# Patient Record
Sex: Male | Born: 2000 | Race: White | Hispanic: No | Marital: Single | Smoking: Former smoker
Health system: Southern US, Community
[De-identification: ages and names within clinical notes are randomized; demographics above are authoritative.]

## PROBLEM LIST (undated history)

## (undated) DIAGNOSIS — T7840XA Allergy, unspecified, initial encounter: Secondary | ICD-10-CM

## (undated) DIAGNOSIS — J45909 Unspecified asthma, uncomplicated: Secondary | ICD-10-CM

## (undated) DIAGNOSIS — Z9229 Personal history of other drug therapy: Secondary | ICD-10-CM

## (undated) DIAGNOSIS — Z789 Other specified health status: Secondary | ICD-10-CM

## (undated) DIAGNOSIS — K219 Gastro-esophageal reflux disease without esophagitis: Secondary | ICD-10-CM

## (undated) DIAGNOSIS — F419 Anxiety disorder, unspecified: Secondary | ICD-10-CM

## (undated) HISTORY — DX: Anxiety disorder, unspecified: F41.9

## (undated) HISTORY — PX: NASAL HEMORRHAGE CONTROL: SHX287

## (undated) HISTORY — PX: UPPER GI ENDOSCOPY: SHX6162

## (undated) HISTORY — PX: TONSILLECTOMY: SUR1361

## (undated) HISTORY — DX: Allergy, unspecified, initial encounter: T78.40XA

## (undated) HISTORY — DX: Gastro-esophageal reflux disease without esophagitis: K21.9

---
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
## 2019-02-10 ENCOUNTER — Ambulatory Visit
Admission: EM | Admit: 2019-02-10 | Discharge: 2019-02-10 | Disposition: A | Discharge status: Home / Self Care | Payer: BC Managed Care – PPO | Attending: Family Medicine | Admitting: Family Medicine

## 2019-02-10 ENCOUNTER — Other Ambulatory Visit: Payer: Self-pay

## 2019-02-10 DIAGNOSIS — R369 Urethral discharge, unspecified: Secondary | ICD-10-CM | POA: Diagnosis not present

## 2019-02-10 LAB — URINALYSIS, COMPLETE (UACMP) WITH MICROSCOPIC
Bacteria, UA: NONE SEEN
Bilirubin Urine: NEGATIVE
Glucose, UA: NEGATIVE mg/dL
Ketones, ur: NEGATIVE mg/dL
Nitrite: NEGATIVE
Protein, ur: NEGATIVE mg/dL
Specific Gravity, Urine: 1.025 (ref 1.005–1.030)
pH: 6 (ref 5.0–8.0)

## 2019-02-10 LAB — CHLAMYDIA/NGC RT PCR (ARMC ONLY)??????????
Chlamydia Tr: NOT DETECTED
N gonorrhoeae: DETECTED — AB

## 2019-02-10 MED ORDER — CEFTRIAXONE SODIUM 250 MG IJ SOLR
250.0000 mg | Freq: Once | INTRAMUSCULAR | Status: AC
  Administered 2019-02-10: 250 mg via INTRAMUSCULAR

## 2019-02-10 MED ORDER — AZITHROMYCIN 500 MG PO TABS
1000.0000 mg | ORAL_TABLET | Freq: Once | ORAL | Status: AC
  Administered 2019-02-10: 1000 mg via ORAL

## 2019-02-10 NOTE — ED Provider Notes (Signed)
[facility]    CSN: [id number] Arrival date & time: 02/10/19  2703     History   Chief Complaint Chief Complaint  Patient presents with  . Dysuria    HPI DAESHON GRAMMATICO is a 18 y.o. male.   18 yo male with a c/o pus discharge from his penis and burning with urination for the past 2 days. Denies any fevers, chills. States he's sexually active and does not always use barrier protection. Denies prior h/o of STDs.     Dysuria Presenting symptoms: dysuria     Past Medical History:  Diagnosis Date  . Immunizations up to date in pediatric patient   . Reactive airway disease     Patient Active Problem List   Diagnosis Date Noted  . Closed fracture of phalanx or phalanges of hand 11/26/2014    Past Surgical History:  Procedure Laterality Date  . TONSILLECTOMY    . UPPER GI ENDOSCOPY         Home Medications    Prior to Admission medications   Not on File    Family History Family History  Problem Relation Age of Onset  . Hypertension Mother   . Diabetes Father     Social History Social History   Tobacco Use  . Smoking status: Never Smoker  . Smokeless tobacco: Current User    Types: Chew  Substance Use Topics  . Alcohol use: No  . Drug use: No     Allergies   Patient has no known allergies.   Review of Systems Review of Systems  Genitourinary: Positive for dysuria.     Physical Exam Triage Vital Signs ED Triage Vitals  Enc Vitals Group     BP 02/10/19 0831 126/78     Pulse Rate 02/10/19 0831 80     Resp 02/10/19 0831 18     Temp 02/10/19 0831 98.3 F (36.8 C)     Temp Source 02/10/19 0831 Oral     SpO2 02/10/19 0831 100 %     Weight 02/10/19 0829 140 lb (63.5 kg)     Height 02/10/19 0829 5\' 11"  (1.803 m)     Head Circumference --      Peak Flow --      Pain Score 02/10/19 0829 10     Pain Loc --      Pain Edu? --      Excl. in [place]? --    No data found.  Updated Vital Signs BP 126/78 (BP Location: Right Arm)    Pulse 80   Temp 98.3 F (36.8 C) (Oral)   Resp 18   Ht 5\' 11"  (1.803 m)   Wt 63.5 kg   SpO2 100%   BMI 19.53 kg/m   Visual Acuity Right Eye Distance:   Left Eye Distance:   Bilateral Distance:    Right Eye Near:   Left Eye Near:    Bilateral Near:     Physical Exam Vitals signs and nursing note reviewed.  Constitutional:      General: He is not in acute distress.    Appearance: He is not toxic-appearing or diaphoretic.  Genitourinary:    Penis: Discharge present.      Scrotum/Testes: Normal.  Lymphadenopathy:     Lower Body: Right inguinal adenopathy present.  Neurological:     Mental Status: He is alert.      UC Treatments / Results  Labs (all labs ordered are listed, but only abnormal results are displayed) Labs  Reviewed  URINALYSIS, COMPLETE (UACMP) WITH MICROSCOPIC - Abnormal; Notable for the following components:      Result Value   Hgb urine dipstick SMALL (*)    Leukocytes,Ua TRACE (*)    All other components within normal limits  CHLAMYDIA/NGC RT PCR [facility] ONLY)    EKG   Radiology No results found.  Procedures Procedures (including critical care time)  Medications Ordered in UC Medications  cefTRIAXone (ROCEPHIN) injection 250 mg (250 mg Intramuscular Given 02/10/19 0921)  azithromycin (ZITHROMAX) tablet 1,000 mg (1,000 mg Oral Given 02/10/19 0921)    Initial Impression / Assessment and Plan / UC Course  I have reviewed the triage vital signs and the nursing notes.  Pertinent labs & imaging results that were available during my care of the patient were reviewed by me and considered in my medical decision making (see chart for details).      Final Clinical Impressions(s) / UC Diagnoses   Final diagnoses:  Penile discharge    ED Prescriptions    None      1. Lab results and diagnosis reviewed with patient 2. Patient given rocephin 250mg  IM x 1 and zithromax 1gm po x 1 3. Await final test results 4. Follow-up prn    Controlled Substance Prescriptions [place] Controlled Substance Registry consulted? Not Applicable   Payton Mccallumonty, [place], MD 02/10/19 [phone]

## 2019-02-10 NOTE — ED Triage Notes (Signed)
Patient complains of painful urination and burning with urination x 1 day. Patient states that he has also noticed a pus like discharge from his penis.

## 2019-02-12 ENCOUNTER — Telehealth (HOSPITAL_COMMUNITY): Payer: Self-pay | Admitting: Emergency Medicine

## 2019-02-12 NOTE — Telephone Encounter (Signed)
Test for gonorrhea was positive. This was treated at the urgent care visit with IM rocephin 250mg and po zithromax 1g. Pt needs education to refrain from sexual intercourse for 7 days after treatment to give the medicine time to work. Sexual partners need to be notified and tested/treated. Condoms may reduce risk of reinfection. Recheck or followup with PCP for further evaluation if symptoms are not improving. [facility] notified.   Attempted to reach patient. No answer at this time. Voicemail left.     

## 2019-02-14 ENCOUNTER — Telehealth (HOSPITAL_COMMUNITY): Payer: Self-pay | Admitting: Emergency Medicine

## 2019-02-14 NOTE — Telephone Encounter (Signed)
Attempted to reach patient x2, mother answered, gave correct number, tried calling patient again, no answer, no voicemail set up.

## 2019-02-17 ENCOUNTER — Telehealth (HOSPITAL_COMMUNITY): Payer: Self-pay | Admitting: Emergency Medicine

## 2019-02-17 NOTE — Telephone Encounter (Signed)
Attempted to reach patient x3. No answer at this time. No voicemail. Letter sent

## 2021-05-24 DIAGNOSIS — J340 Abscess, furuncle and carbuncle of nose: Secondary | ICD-10-CM | POA: Diagnosis not present

## 2021-05-24 DIAGNOSIS — R040 Epistaxis: Secondary | ICD-10-CM | POA: Diagnosis not present

## 2021-06-13 ENCOUNTER — Ambulatory Visit: Admit: 2021-06-13 | Discharge status: Against Medical Advice | Payer: BC Managed Care – PPO

## 2021-06-13 ENCOUNTER — Ambulatory Visit
Admission: EM | Admit: 2021-06-13 | Discharge: 2021-06-13 | Disposition: A | Discharge status: Home / Self Care | Payer: BC Managed Care – PPO | Attending: Family | Admitting: Family

## 2021-06-13 ENCOUNTER — Other Ambulatory Visit: Payer: Self-pay

## 2021-06-13 DIAGNOSIS — R051 Acute cough: Secondary | ICD-10-CM | POA: Diagnosis not present

## 2021-06-13 DIAGNOSIS — J111 Influenza due to unidentified influenza virus with other respiratory manifestations: Secondary | ICD-10-CM

## 2021-06-13 DIAGNOSIS — R52 Pain, unspecified: Secondary | ICD-10-CM

## 2021-06-13 MED ORDER — OSELTAMIVIR PHOSPHATE 75 MG PO CAPS: 75.0000 mg | ORAL_CAPSULE | Freq: Two times a day (BID) | ORAL | 0 refills | Status: AC

## 2021-06-13 MED ORDER — ACETAMINOPHEN 325 MG PO TABS
650.0000 mg | ORAL_TABLET | Freq: Once | ORAL | Status: AC
  Administered 2021-06-13: 650 mg via ORAL

## 2021-06-13 NOTE — ED Provider Notes (Signed)
[facility]    CSN: [id number] Arrival date & time: 06/13/21  1751      History   Chief Complaint Chief Complaint  Patient presents with   Cough   Generalized Body Aches    HPI Mike Diaz is a 20 y.o. male.   20 year old male presents with irritated throat, body aches, cough, headache and fever of 101 since last night. Also having some nasal drainage and congestion. Denies any GI symptoms. Took Nyquil last night with minimal relief. No known exposure to Influenza or COVID 19. No other family members ill. No other chronic health issues. Takes no daily medication.   The history is provided by the patient.   Past Medical History:  Diagnosis Date   Immunizations up to date in pediatric patient    Reactive airway disease     Patient Active Problem List   Diagnosis Date Noted   Closed fracture of phalanx or phalanges of hand 11/26/2014    Past Surgical History:  Procedure Laterality Date   TONSILLECTOMY     UPPER GI ENDOSCOPY         Home Medications    Prior to Admission medications   Medication Sig Start Date End Date Taking? Authorizing Provider  oseltamivir (TAMIFLU) 75 MG capsule Take 1 capsule (75 mg total) by mouth every 12 (twelve) hours for 5 days. 06/13/21 06/18/21 Yes Lucila Klecka, Ali Lowe, NP    Family History Family History  Problem Relation Age of Onset   Hypertension Mother    Diabetes Father     Social History Social History   Tobacco Use   Smoking status: Never   Smokeless tobacco: Current    Types: Chew  Vaping Use   Vaping Use: Never used  Substance Use Topics   Alcohol use: No   Drug use: No     Allergies   Patient has no known allergies.   Review of Systems Review of Systems  Constitutional:  Positive for chills, fatigue and fever. Negative for activity change.  HENT:  Positive for congestion, postnasal drip, rhinorrhea and sore throat. Negative for ear discharge, ear pain, facial swelling, mouth sores,  nosebleeds, sinus pressure, sinus pain and trouble swallowing.   Eyes:  Negative for pain, discharge, redness and itching.  Respiratory:  Positive for cough. Negative for chest tightness and shortness of breath.   Gastrointestinal:  Negative for diarrhea, nausea and vomiting.  Musculoskeletal:  Positive for arthralgias and myalgias. Negative for neck pain and neck stiffness.  Skin:  Negative for color change and rash.  Allergic/Immunologic: Negative for environmental allergies, food allergies and immunocompromised state.  Neurological:  Positive for headaches. Negative for dizziness, tremors, seizures, syncope, weakness and numbness.  Hematological:  Negative for adenopathy. Does not bruise/bleed easily.    Physical Exam Triage Vital Signs ED Triage Vitals  Enc Vitals Group     BP 06/13/21 1801 122/70     Pulse Rate 06/13/21 1801 (!) 104     Resp 06/13/21 1801 18     Temp 06/13/21 1801 (!) 101.5 F (38.6 C)     Temp Source 06/13/21 1801 Oral     SpO2 06/13/21 1801 100 %     Weight 06/13/21 1801 145 lb (65.8 kg)     Height 06/13/21 1801 5\' 11"  (1.803 m)     Head Circumference --      Peak Flow --      Pain Score 06/13/21 1800 6     Pain Loc --  Pain Edu? --      Excl. in [place]? --    No data found.  Updated Vital Signs BP 122/70 (BP Location: Left Arm)   Pulse (!) 104   Temp (!) 101.5 F (38.6 C) (Oral)   Resp 18   Ht 5\' 11"  (1.803 m)   Wt 145 lb (65.8 kg)   SpO2 100%   BMI 20.22 kg/m   Visual Acuity Right Eye Distance:   Left Eye Distance:   Bilateral Distance:    Right Eye Near:   Left Eye Near:    Bilateral Near:     Physical Exam Vitals and nursing note reviewed.  Constitutional:      General: He is awake. He is not in acute distress.    Appearance: He is well-developed. He is ill-appearing.     Comments: He is sitting on the exam table in no acute distress but appears tired and ill.   HENT:     Head: Normocephalic and atraumatic.     Right Ear:  Hearing, tympanic membrane, ear canal and external ear normal.     Left Ear: Hearing, tympanic membrane, ear canal and external ear normal.     Nose: Congestion and rhinorrhea present. Rhinorrhea is clear.     Right Sinus: No maxillary sinus tenderness or frontal sinus tenderness.     Left Sinus: No maxillary sinus tenderness or frontal sinus tenderness.     Mouth/Throat:     Lips: Pink.     Mouth: Mucous membranes are moist.     Pharynx: Uvula midline. Posterior oropharyngeal erythema present. No pharyngeal swelling, oropharyngeal exudate or uvula swelling.     Comments: Tonsils absent Eyes:     Extraocular Movements: Extraocular movements intact.     Conjunctiva/sclera: Conjunctivae normal.  Cardiovascular:     Rate and Rhythm: Regular rhythm. Tachycardia present.     Heart sounds: Normal heart sounds. No murmur heard. Pulmonary:     Effort: Pulmonary effort is normal. No respiratory distress.     Breath sounds: Normal breath sounds and air entry. No decreased air movement. No decreased breath sounds, wheezing, rhonchi or rales.  Musculoskeletal:     Cervical back: Normal range of motion and neck supple.  Lymphadenopathy:     Cervical: No cervical adenopathy.  Skin:    General: Skin is warm and dry.     Capillary Refill: Capillary refill takes less than 2 seconds.  Neurological:     General: No focal deficit present.     Mental Status: He is alert and oriented to person, place, and time.  Psychiatric:        Mood and Affect: Mood normal.        Behavior: Behavior normal. Behavior is cooperative.        Thought Content: Thought content normal.        Judgment: Judgment normal.     UC Treatments / Results  Labs (all labs ordered are listed, but only abnormal results are displayed) Labs Reviewed - No data to display  EKG   Radiology No results found.  Procedures Procedures (including critical care time)  Medications Ordered in UC Medications  acetaminophen  (TYLENOL) tablet 650 mg (650 mg Oral Given 06/13/21 1819)    Initial Impression / Assessment and Plan / UC Course  I have reviewed the triage vital signs and the nursing notes.  Pertinent labs & imaging results that were available during my care of the patient were reviewed by me and considered in  my medical decision making (see chart for details).     Reviewed with patient that he probably has Influenza. Unable to perform rapid flu test due to availability. Since very prevalent in the community and his symptoms and clinical findings are typical of Influenza, will treat for Influenza. May start Tamiflu 75mg  twice a day as directed. May continue OTC Dayquil and Nyquil as needed to help with congestion and cough. Continue to push fluids to help loosen up mucus in chest. May take OTC Ibuprofen 600mg  every 8 hours as needed for body aches/fever. Rest. Stay at home. Follow-up in 3 to 4 days if minimal improvement or sooner if worsening.    Final Clinical Impressions(s) / UC Diagnoses   Final diagnoses:  Influenza  Generalized body aches  Acute cough     Discharge Instructions      Recommend Tamiflu 75mg  twice a day for 5 days. Continue to push fluids to help loosen up mucus in chest. May continue OTC Dayquil and Nyquil as directed. May take OTC Ibuprofen 600mg  every 8 hours as needed for body aches/fever. Rest. Stay at home. Follow-up in 3 to 4 days if not improving.     ED Prescriptions     Medication Sig Dispense Auth. Provider   oseltamivir (TAMIFLU) 75 MG capsule Take 1 capsule (75 mg total) by mouth every 12 (twelve) hours for 5 days. 10 capsule Ruffin Lada, , NP      PDMP not reviewed this encounter.   , NP 06/14/21 1029

## 2021-06-13 NOTE — Discharge Instructions (Signed)
Recommend Tamiflu 75mg  twice a day for 5 days. Continue to push fluids to help loosen up mucus in chest. May continue OTC Dayquil and Nyquil as directed. May take OTC Ibuprofen 600mg  every 8 hours as needed for body aches/fever. Rest. Stay at home. Follow-up in 3 to 4 days if not improving.

## 2021-06-13 NOTE — ED Triage Notes (Signed)
Pt here with C/O cough, body aches, fever since last night.

## 2021-08-15 ENCOUNTER — Ambulatory Visit
Admission: EM | Admit: 2021-08-15 | Discharge: 2021-08-15 | Disposition: A | Discharge status: Home / Self Care | Payer: BC Managed Care – PPO | Attending: Internal Medicine | Admitting: Internal Medicine

## 2021-08-15 ENCOUNTER — Other Ambulatory Visit: Payer: Self-pay

## 2021-08-15 DIAGNOSIS — R339 Retention of urine, unspecified: Secondary | ICD-10-CM

## 2021-08-15 DIAGNOSIS — R300 Dysuria: Secondary | ICD-10-CM | POA: Diagnosis not present

## 2021-08-15 LAB — URINALYSIS, MICROSCOPIC (REFLEX): Bacteria, UA: NONE SEEN

## 2021-08-15 LAB — URINALYSIS, ROUTINE W REFLEX MICROSCOPIC
Bilirubin Urine: NEGATIVE
Glucose, UA: NEGATIVE mg/dL
Ketones, ur: NEGATIVE mg/dL
Leukocytes,Ua: NEGATIVE
Nitrite: NEGATIVE
Protein, ur: NEGATIVE mg/dL
Specific Gravity, Urine: 1.015 (ref 1.005–1.030)
pH: 5.5 (ref 5.0–8.0)

## 2021-08-15 MED ORDER — PHENAZOPYRIDINE HCL 200 MG PO TABS: 200.0000 mg | ORAL_TABLET | Freq: Three times a day (TID) | ORAL | 0 refills | Status: AC

## 2021-08-15 MED ORDER — TAMSULOSIN HCL 0.4 MG PO CAPS: 0.4000 mg | ORAL_CAPSULE | Freq: Every day | ORAL | 0 refills | Status: AC

## 2021-08-15 NOTE — Discharge Instructions (Signed)
-  Urinalysis negative -Other urine test should complete tomorrow.  Will notify of any changes needed in treatment -Pyridium: 1 tablet 3 times a day for 2 days for pain.  This should also help with retention -Flomax: 1 tablet daily for 5 days to help with retention -Ibuprofen and Tylenol for pain -Push fluids -Follow-up with this clinic or primary care as needed.

## 2021-08-15 NOTE — ED Triage Notes (Signed)
Pt c/o painful urination x5days. Pt states that it has gotten worse.   Pt states that he has had no odor and had slight discharge when waking up in the morning. Pt states that he is feeling pressure in his abdomen and that when he urinates he is not getting everything out.   Pt last had unprotected sex on 07/11/21.

## 2021-08-15 NOTE — ED Provider Notes (Signed)
[facility]    CSN: [id number] Arrival date & time: 08/15/21  6578      History   Chief Complaint Chief Complaint  Patient presents with   Urinary pressure    HPI Mike Diaz is a 21 y.o. male.   Patient is a 21 year old male who presents with chief complaint of pain with urination.  Patient reports the pain is during and after urination.  Patient also reports urinary "pressure".  Patient states he did have 1 episode of discharge 2 days ago in the morning.  Denies any unusual smell.  Patient denies any testicular pain.  Reports last having unprotected sex January 9.  He also reports some postvoid residual.  Patient states he had gonorrhea about 2 years ago that was very painful.  He reports this is different.  Patient denies any pain with sitting or with bowel movements.   Past Medical History:  Diagnosis Date   Immunizations up to date in pediatric patient    Reactive airway disease     Patient Active Problem List   Diagnosis Date Noted   Closed fracture of phalanx or phalanges of hand 11/26/2014    Past Surgical History:  Procedure Laterality Date   TONSILLECTOMY     UPPER GI ENDOSCOPY         Home Medications    Prior to Admission medications   Medication Sig Start Date End Date Taking? Authorizing Provider  phenazopyridine (PYRIDIUM) 200 MG tablet Take 1 tablet (200 mg total) by mouth 3 (three) times daily for 2 days. 08/15/21 08/17/21 Yes Candis Schatz, PA-C  tamsulosin (FLOMAX) 0.4 MG CAPS capsule Take 1 capsule (0.4 mg total) by mouth daily for 5 days. 08/15/21 08/20/21 Yes Candis Schatz, PA-C    Family History Family History  Problem Relation Age of Onset   Hypertension Mother    Diabetes Father     Social History Social History   Tobacco Use   Smoking status: Never   Smokeless tobacco: Former    Types: Associate Professor Use: Never used  Substance Use Topics   Alcohol use: Yes   Drug use: No     Allergies    Patient has no known allergies.   Review of Systems Review of Systems as noted in HPI.  Other systems reviewed and found negative   Physical Exam Triage Vital Signs ED Triage Vitals  Enc Vitals Group     BP 08/15/21 0930 118/74     Pulse Rate 08/15/21 0930 71     Resp 08/15/21 0930 18     Temp 08/15/21 0930 98 F (36.7 C)     Temp Source 08/15/21 0930 Oral     SpO2 08/15/21 0930 100 %     Weight 08/15/21 0929 150 lb (68 kg)     Height 08/15/21 0929 5\' 11"  (1.803 m)     Head Circumference --      Peak Flow --      Pain Score 08/15/21 0929 1     Pain Loc --      Pain Edu? --      Excl. in GC? --    No data found.  Updated Vital Signs BP 118/74 (BP Location: Left Arm)    Pulse 71    Temp 98 F (36.7 C) (Oral)    Resp 18    Ht 5\' 11"  (1.803 m)    Wt 150 lb (68 kg)    SpO2 100%  BMI 20.92 kg/m    Physical Exam Constitutional:      Appearance: Normal appearance. He is not ill-appearing.  Cardiovascular:     Rate and Rhythm: Normal rate.  Pulmonary:     Effort: Pulmonary effort is normal.     Breath sounds: Normal breath sounds.  Genitourinary:    Penis: Tenderness (to palpation of glans) present.      Testes: Normal.        Right: Mass or tenderness not present.        Left: Mass or tenderness not present.  Neurological:     Mental Status: He is alert.     UC Treatments / Results  Labs (all labs ordered are listed, but only abnormal results are displayed) Labs Reviewed  URINALYSIS, ROUTINE W REFLEX MICROSCOPIC - Abnormal; Notable for the following components:      Result Value   Hgb urine dipstick TRACE (*)    All other components within normal limits  URINALYSIS, MICROSCOPIC (REFLEX)  CYTOLOGY, (ORAL, ANAL, URETHRAL) ANCILLARY ONLY    EKG   Radiology No results found.  Procedures Procedures (including critical care time)  Medications Ordered in UC Medications - No data to display  Initial Impression / Assessment and Plan / UC Course  I have  reviewed the triage vital signs and the nursing notes.  Pertinent labs & imaging results that were available during my care of the patient were reviewed by me and considered in my medical decision making (see chart for details).     Patient presents with painful urination for 5 days.  Pain mostly during and after urination.  Patient was wearing pressure as well as urinary retention last unprotected sex on January 19 and 1 episode of penile discharge 2 days ago.  Urinalysis fairly benign as above.  Cytology to complete tomorrow.  Given prescription for Pyridium for his pain as well as Flomax for retention.  Have him push fluids.  We will advise him of the cytology results. Final Clinical Impressions(s) / UC Diagnoses   Final diagnoses:  Dysuria  Urinary retention     Discharge Instructions      -Urinalysis negative -Other urine test should complete tomorrow.  Will notify of any changes needed in treatment -Pyridium: 1 tablet 3 times a day for 2 days for pain.  This should also help with retention -Flomax: 1 tablet daily for 5 days to help with retention -Ibuprofen and Tylenol for pain -Push fluids -Follow-up with this clinic or primary care as needed.     ED Prescriptions     Medication Sig Dispense Auth. Provider   phenazopyridine (PYRIDIUM) 200 MG tablet Take 1 tablet (200 mg total) by mouth 3 (three) times daily for 2 days. 6 tablet Candis Schatz, PA-C   tamsulosin (FLOMAX) 0.4 MG CAPS capsule Take 1 capsule (0.4 mg total) by mouth daily for 5 days. 5 capsule Candis Schatz, PA-C      PDMP not reviewed this encounter.   Candis Schatz, PA-C 08/15/21 1019

## 2021-08-16 ENCOUNTER — Telehealth: Payer: Self-pay | Admitting: Physician Assistant

## 2021-08-16 LAB — CYTOLOGY, (ORAL, ANAL, URETHRAL) ANCILLARY ONLY
Chlamydia: POSITIVE — AB
Comment: NEGATIVE
Comment: NEGATIVE
Comment: NORMAL
Neisseria Gonorrhea: NEGATIVE
Trichomonas: NEGATIVE

## 2021-08-16 NOTE — Telephone Encounter (Signed)
Received a call from [facility] requesting clarification on Mike Diaz for where the Aptima swab sample was collected from. They were informed that it was a Urethral swab.

## 2021-08-17 ENCOUNTER — Telehealth (HOSPITAL_COMMUNITY): Payer: Self-pay | Admitting: Emergency Medicine

## 2021-08-17 MED ORDER — DOXYCYCLINE HYCLATE 100 MG PO CAPS: 100.0000 mg | ORAL_CAPSULE | Freq: Two times a day (BID) | ORAL | 0 refills | Status: AC

## 2021-09-02 ENCOUNTER — Ambulatory Visit: Payer: Self-pay | Admitting: Family Medicine

## 2021-10-20 ENCOUNTER — Encounter: Payer: Self-pay | Admitting: Nurse Practitioner

## 2021-10-20 ENCOUNTER — Ambulatory Visit: Payer: BC Managed Care – PPO | Admitting: Nurse Practitioner

## 2021-10-20 VITALS — BP 130/70 | HR 76 | Temp 97.9°F | Resp 14 | Ht 71.0 in | Wt 156.1 lb

## 2021-10-20 DIAGNOSIS — Z0000 Encounter for general adult medical examination without abnormal findings: Secondary | ICD-10-CM | POA: Diagnosis not present

## 2021-10-20 DIAGNOSIS — F32A Depression, unspecified: Secondary | ICD-10-CM | POA: Diagnosis not present

## 2021-10-20 DIAGNOSIS — R5383 Other fatigue: Secondary | ICD-10-CM | POA: Diagnosis not present

## 2021-10-20 DIAGNOSIS — F419 Anxiety disorder, unspecified: Secondary | ICD-10-CM

## 2021-10-20 DIAGNOSIS — Z23 Encounter for immunization: Secondary | ICD-10-CM

## 2021-10-20 HISTORY — DX: Anxiety disorder, unspecified: F41.9

## 2021-10-20 HISTORY — DX: Depression, unspecified: F32.A

## 2021-10-20 MED ORDER — SERTRALINE HCL 25 MG PO TABS: ORAL_TABLET | ORAL | 0 refills | Status: DC

## 2021-10-20 NOTE — Assessment & Plan Note (Signed)
Likely multifactorial in regards to his work schedule.  We will rule out metabolic issues pending lab results. ?

## 2021-10-20 NOTE — Assessment & Plan Note (Signed)
Discussed age-appropriate immunizations and screening exams.  Patient only received 1 dose of Menveo meningitis vaccine but has aged out for the booster.  Did update Td today ?

## 2021-10-20 NOTE — Patient Instructions (Signed)
Nice to see you today ?I will be in touch with the lab results once I have them ?I sent the medicatoin to your pharmacy. You will take 1 tablet (25mg ) before bed for 2 weeks. If you are doing well then you will take 2 tablets each night before bed (total of 50mg ) ? ?Follow up with me in 6 weeks for recheck ?

## 2021-10-20 NOTE — Progress Notes (Signed)
? ?New Patient Office Visit ? ?Subjective   ? ?Patient ID: Mike Diaz, male    DOB: 2000/07/25  Age: 21 y.o. MRN: [id number] ? ?CC:  ?Chief Complaint  ?Patient presents with  ? Establish Care  ?  Previous PCP with Dr Delfino [place].   ? Annual Exam  ? ? ?HPI ?Mike Diaz presents to establish care ? ?for complete physical and follow up of chronic conditions. ? ?Immunizations: ?-Tetanus: due 2023 ?-Influenza: refused ?-Covid-19: J&J ?-Shingles: Too young ?-Pneumonia: Too young ? ? ?Diet: Fair diet. 4-5 smaller meals a day. Snacks in between.Diet soda 90% of time and Dr.pepper. ?Exercise:  Daily with weights and some cardio and works out for approx 1 hour 1.5  ? ?Eye exam: 2-3 years ago.  As needed ?Dental exam: Completes semi-annually  ? ? ?Colonoscopy: Too young.  Does have a grandparent with colon cancer history. ?Dexa: N/A ?PSA: Too young  ? ?Lung Cancer Screening: N/A patient no longer uses electronic cigarettes. ? ?Sleep: when not at work. Late bedtime and get up late. Does not feel rested but sleeps. Does not think that he snores. ? ?Outpatient Encounter Medications as of 10/20/2021  ?Medication Sig  ? sertraline (ZOLOFT) 25 MG tablet Take 1 tablet (25 mg total) by mouth daily for 15 days, THEN 2 tablets (50 mg total) daily for 15 days.  ? ?No facility-administered encounter medications on file as of 10/20/2021.  ? ? ?Past Medical History:  ?Diagnosis Date  ? Allergy 2015  ? Anxiety   ? Anxiety and depression 10/20/2021  ? GERD (gastroesophageal reflux disease)   ? Immunizations up to date in pediatric patient   ? Reactive airway disease   ? ? ?Past Surgical History:  ?Procedure Laterality Date  ? NASAL HEMORRHAGE CONTROL    ? have done cautherization for this in the past  ? TONSILLECTOMY    ? UPPER GI ENDOSCOPY    ? ? ?Family History  ?Problem Relation Age of Onset  ? Hypertension Mother   ? Skin cancer Mother   ? Diabetes Father   ? Learning disabilities Father   ?     dyslexia  ? Varicose  Veins Father   ? Learning disabilities Sister   ?     dyslexia  ? Other Sister   ?     Potts disease  ? Brain cancer Maternal Grandmother   ? Pancreatic cancer Maternal Grandmother   ? Pancreatic cancer Maternal Grandfather   ? Colon cancer Maternal Grandfather   ? Brain cancer Paternal Grandmother   ? Brain cancer Paternal Grandfather   ? Lung cancer Paternal Grandfather   ? ? ?Social History  ? ?Socioeconomic History  ? Marital status: Single  ?  Spouse name: Not on file  ? Number of children: 0  ? Years of education: high school  ? Highest education level: Not on file  ?Occupational History  ? Not on file  ?Tobacco Use  ? Smoking status: Former  ?  Types: E-cigarettes  ?  Quit date: 06/02/2021  ?  Years since quitting: 0.3  ? Smokeless tobacco: Former  ?  Types: Chew  ? Tobacco comments:  ?  I vaped and used smokeless tobacco for approximately 5/6 years-quit 06/2021  ?Vaping Use  ? Vaping Use: Former  ? Quit date: 06/02/2021  ?Substance and Sexual Activity  ? Alcohol use: Yes  ?  Comment: maybe 4 beers a week and sometimes less or more  ? Drug use: No  ?  Sexual activity: Yes  ?  Birth control/protection: Condom  ?Other Topics Concern  ? Not on file  ?Social History Narrative  ? Fulltime: Public relations account executive department  ?   ? Hobbies: golf, hunting, fishings  ? ?Social Determinants of Health  ? ?Financial Resource Strain: Not on file  ?Food Insecurity: Not on file  ?Transportation Needs: Not on file  ?Physical Activity: Not on file  ?Stress: Not on file  ?Social Connections: Not on file  ?Intimate Partner Violence: Not on file  ? ? ?Review of Systems  ?Constitutional:  Positive for malaise/fatigue. Negative for chills and fever.  ?Respiratory:  Negative for cough and shortness of breath.   ?Cardiovascular:  Negative for chest pain, palpitations and leg swelling.  ?Gastrointestinal:  Negative for diarrhea, nausea and vomiting.  ?     BM every other day ?  ?Genitourinary:  Negative for dysuria, frequency and hematuria.  ?      Nocturia "-"  ?Neurological:  Negative for dizziness, tingling, weakness and headaches.  ?Psychiatric/Behavioral:  Negative for hallucinations and suicidal ideas.   ? ?  ? ? ?Objective   ? ?BP 130/70   Pulse 76   Temp 97.9 ?F (36.6 ?C)   Resp 14   Ht 5\' 11"  (1.803 m)   Wt 156 lb 2 oz (70.8 kg)   SpO2 98%   BMI 21.78 kg/m?  ? ?Physical Exam ?Vitals and nursing note reviewed. Exam conducted with a chaperone present [facility] Donald, RMA).  ?Constitutional:   ?   Appearance: Normal appearance.  ?HENT:  ?   Right Ear: Tympanic membrane, ear canal and external ear normal.  ?   Left Ear: Tympanic membrane, ear canal and external ear normal.  ?   Mouth/Throat:  ?   Mouth: Mucous membranes are moist.  ?   Pharynx: Oropharynx is clear.  ?Eyes:  ?   Extraocular Movements: Extraocular movements intact.  ?   Pupils: Pupils are equal, round, and reactive to light.  ?Cardiovascular:  ?   Rate and Rhythm: Normal rate and regular rhythm.  ?   Heart sounds: Normal heart sounds.  ?Pulmonary:  ?   Effort: Pulmonary effort is normal.  ?   Breath sounds: Normal breath sounds.  ?Abdominal:  ?   General: Bowel sounds are normal.  ?   Palpations: There is no mass.  ?   Tenderness: There is no abdominal tenderness.  ?   Hernia: No hernia is present.  ?Genitourinary: ?   Penis: Normal.   ?   Comments: Left testicle absent ?Musculoskeletal:  ?   Right lower leg: No edema.  ?   Left lower leg: No edema.  ?Lymphadenopathy:  ?   Cervical: No cervical adenopathy.  ?Skin: ?   General: Skin is warm.  ?Neurological:  ?   General: No focal deficit present.  ?   Mental Status: He is alert.  ?   Comments: Bilateral upper and lower extremity strength 5/5  ?Psychiatric:     ?   Mood and Affect: Mood normal.     ?   Behavior: Behavior normal.     ?   Thought Content: Thought content normal.     ?   Judgment: Judgment normal.  ? ? ? ?  ? ?Assessment & Plan:  ? ?Problem List Items Addressed This Visit   ? ?  ? Other  ? Anxiety and depression  ?   Minister PHQ-9 and GAD-7 in office.  Patient does have difficulty with  feeling fatigued all the time.  He does do 24 hours shifts every other day and then last 4 days off.  This could be multifactorial in regards to anxiety depression, sleep apnea, work schedule.  After further conversations with patient we will elect to treat with Zoloft 25 mg nightly for 2 weeks if tolerating well will titrate to sertraline 50 mg nightly.  Did discuss common side effects inclusive of sexual dysfunction, weight gain, GI upset, and SI/HI thoughts.  Patient was informed to let us know if any of these side effects happen.  He acknowledged ? ?  ?  ? Relevant Medications  ? sertraline (ZOLOFT) 25 MG tablet  ? Other Relevant Orders  ? TSH  ? Preventative health care - Primary  ?  Discussed age-appropriate immunizations and screening exams.  Patient only received 1 dose of Menveo meningitis vaccine but has aged out for the booster.  Did update Td today ? ?  ?  ? Relevant Orders  ? CBC with Differential/Platelet  ? Comprehensive metabolic panel  ? Lipid panel  ? TSH  ? Other fatigue  ?  Likely multifactorial in regards to his work schedule.  We will rule out metabolic issues pending lab results. ? ?  ?  ? Relevant Orders  ? TSH  ? Vitamin B12  ? ?Other Visit Diagnoses   ? ? Need for Td vaccine      ? Relevant Orders  ? Td : Tetanus/diphtheria >7yo Preservative  free (Completed)  ? ?  ? ? ?Return in about 6 weeks (around 12/01/2021) for medication recheck.  ? ?Audria Nine, NP ? ? ?

## 2021-10-20 NOTE — Assessment & Plan Note (Signed)
Minister PHQ-9 and GAD-7 in office.  Patient does have difficulty with feeling fatigued all the time.  He does do 24 hours shifts every other day and then last 4 days off.  This could be multifactorial in regards to anxiety depression, sleep apnea, work schedule.  After further conversations with patient we will elect to treat with Zoloft 25 mg nightly for 2 weeks if tolerating well will titrate to sertraline 50 mg nightly.  Did discuss common side effects inclusive of sexual dysfunction, weight gain, GI upset, and SI/HI thoughts.  Patient was informed to let us know if any of these side effects happen.  He acknowledged ?

## 2021-10-21 LAB — CBC WITH DIFFERENTIAL/PLATELET
Basophils Absolute: 0 10*3/uL (ref 0.0–0.1)
Basophils Relative: 0.6 % (ref 0.0–3.0)
Eosinophils Absolute: 0.1 10*3/uL (ref 0.0–0.7)
Eosinophils Relative: 1.1 % (ref 0.0–5.0)
HCT: 47.7 % (ref 39.0–52.0)
Hemoglobin: 16.1 g/dL (ref 13.0–17.0)
Lymphocytes Relative: 33.5 % (ref 12.0–46.0)
Lymphs Abs: 1.7 10*3/uL (ref 0.7–4.0)
MCHC: 33.8 g/dL (ref 30.0–36.0)
MCV: 90.8 fl (ref 78.0–100.0)
Monocytes Absolute: 0.4 10*3/uL (ref 0.1–1.0)
Monocytes Relative: 8.7 % (ref 3.0–12.0)
Neutro Abs: 2.8 10*3/uL (ref 1.4–7.7)
Neutrophils Relative %: 56.1 % (ref 43.0–77.0)
Platelets: 178 10*3/uL (ref 150.0–400.0)
RBC: 5.26 Mil/uL (ref 4.22–5.81)
RDW: 12.7 % (ref 11.5–15.5)
WBC: 5.1 10*3/uL (ref 4.0–10.5)

## 2021-10-21 LAB — COMPREHENSIVE METABOLIC PANEL
ALT: 27 U/L (ref 0–53)
AST: 23 U/L (ref 0–37)
Albumin: 4.5 g/dL (ref 3.5–5.2)
Alkaline Phosphatase: 73 U/L (ref 39–117)
BUN: 16 mg/dL (ref 6–23)
CO2: 31 mEq/L (ref 19–32)
Calcium: 9.7 mg/dL (ref 8.4–10.5)
Chloride: 103 mEq/L (ref 96–112)
Creatinine, Ser: 1.04 mg/dL (ref 0.40–1.50)
GFR: 101.82 mL/min (ref >60.00)
Glucose, Bld: 66 mg/dL — ABNORMAL LOW (ref 70–99)
Potassium: 4.4 mEq/L (ref 3.5–5.1)
Sodium: 141 mEq/L (ref 135–145)
Total Bilirubin: 1.9 mg/dL — ABNORMAL HIGH (ref 0.2–1.2)
Total Protein: 6.8 g/dL (ref 6.0–8.3)

## 2021-10-21 LAB — LIPID PANEL
Cholesterol: 180 mg/dL (ref 0–200)
HDL: 54.1 mg/dL (ref >39.00)
LDL Cholesterol: 114 mg/dL — ABNORMAL HIGH (ref 0–99)
NonHDL: 125.72
Total CHOL/HDL Ratio: 3
Triglycerides: 61 mg/dL (ref 0.0–149.0)
VLDL: 12.2 mg/dL (ref 0.0–40.0)

## 2021-10-21 LAB — VITAMIN B12: Vitamin B-12: 233 pg/mL (ref 211–911)

## 2021-10-21 LAB — TSH: TSH: 0.91 u[IU]/mL (ref 0.35–5.50)

## 2021-12-08 ENCOUNTER — Ambulatory Visit: Payer: BC Managed Care – PPO | Admitting: Nurse Practitioner

## 2021-12-13 ENCOUNTER — Encounter: Payer: Self-pay | Admitting: Nurse Practitioner

## 2021-12-13 ENCOUNTER — Ambulatory Visit: Payer: BC Managed Care – PPO | Admitting: Nurse Practitioner

## 2021-12-13 VITALS — BP 100/60 | HR 88 | Temp 97.5°F | Ht 71.0 in | Wt 157.5 lb

## 2021-12-13 DIAGNOSIS — F419 Anxiety disorder, unspecified: Secondary | ICD-10-CM | POA: Diagnosis not present

## 2021-12-13 DIAGNOSIS — F32A Depression, unspecified: Secondary | ICD-10-CM | POA: Diagnosis not present

## 2021-12-13 MED ORDER — SERTRALINE HCL 50 MG PO TABS
50.0000 mg | ORAL_TABLET | Freq: Every day | ORAL | 3 refills | Status: DC
Start: 2021-12-13 — End: 2024-01-18

## 2021-12-13 NOTE — Assessment & Plan Note (Signed)
Patient doing well on sertraline 50 mg daily.  Patient denies adverse drug events.  Patient denies HI/SI/AVH.  Did readminister PHQ-9 and GAD-7 in office and patient had a great improvement in both scores.  Continue taking sertraline 50 mg daily new updated prescription sent to pharmacy patient aware of milligrams to tablet dose change.

## 2021-12-13 NOTE — Patient Instructions (Addendum)
Nice to see you today I sent in a update prescription that is 50mg  tablets. You will just take ONE of these tablets a day Follow up if you need me in between, if not I will see you towards the end of April 2024 for your next physical

## 2021-12-13 NOTE — Progress Notes (Signed)
Established Patient Office Visit  Subjective   Patient ID: Mike Diaz, male    DOB: [birth year]  Age: 21 y.o. MRN: [id number]  Chief Complaint  Patient presents with   Follow-up    On medicine    HPI  Anxiety and depression: Patient was seen on 10/20/2021 to establish care and dx with anxiety and depression. He was started on xoloft 25mg  for 2 weeks then titrated up to 50mg  daily. States that he can see a difference with the medicatoin. He is able to lay down and go to sleep now. Not having difficulty going to sleep now. States that globally he feels better. Having a little more motivation. He denes any adverse drug events. No Hi/SI/AVH.     12/13/2021   11:28 AM 10/20/2021    3:49 PM  PHQ9 SCORE ONLY  PHQ-9 Total Score 4 17       12/13/2021   11:29 AM 10/20/2021    3:49 PM  GAD 7 : Generalized Anxiety Score  Nervous, Anxious, on Edge 0 2  Control/stop worrying 0 2  Worry too much - different things 0 2  Trouble relaxing 1 2  Restless 1 2  Easily annoyed or irritable 0 3  Afraid - awful might happen 0 0  Total GAD 7 Score 2 13  Anxiety Difficulty Somewhat difficult Very difficult       Review of Systems  Constitutional:  Negative for chills and fever.  Gastrointestinal:  Negative for diarrhea, nausea and vomiting.  Genitourinary:        Denies sexual dysfunction   Neurological:  Negative for headaches.  Psychiatric/Behavioral:  Negative for hallucinations and suicidal ideas. The patient does not have insomnia.       Objective:     BP 100/60   Pulse 88   Temp (!) 97.5 F (36.4 C) (Skin)   Ht 5\' 11"  (1.803 m)   Wt 157 lb 8 oz (71.4 kg)   SpO2 96%   BMI 21.97 kg/m  BP Readings from Last 3 Encounters:  12/13/21 100/60  10/20/21 130/70  08/15/21 118/74   Wt Readings from Last 3 Encounters:  12/13/21 157 lb 8 oz (71.4 kg)  10/20/21 156 lb 2 oz (70.8 kg)  08/15/21 150 lb (68 kg)      Physical Exam Vitals and nursing note reviewed.   Constitutional:      Appearance: Normal appearance.  Cardiovascular:     Rate and Rhythm: Normal rate and regular rhythm.     Heart sounds: Normal heart sounds.  Pulmonary:     Effort: Pulmonary effort is normal.     Breath sounds: Normal breath sounds.  Abdominal:     General: Bowel sounds are normal.  Neurological:     Mental Status: He is alert.  Psychiatric:        Mood and Affect: Mood normal.        Behavior: Behavior normal.        Thought Content: Thought content normal.        Judgment: Judgment normal.      No results found for any visits on 12/13/21.    The ASCVD Risk score (Arnett DK, et al., 2019) failed to calculate for the following reasons:   The 2019 ASCVD risk score is only valid for ages 39 to 73    Assessment & Plan:   Problem List Items Addressed This Visit       Other   Anxiety and depression - Primary  Patient doing well on sertraline 50 mg daily.  Patient denies adverse drug events.  Patient denies HI/SI/AVH.  Did readminister PHQ-9 and GAD-7 in office and patient had a great improvement in both scores.  Continue taking sertraline 50 mg daily new updated prescription sent to pharmacy patient aware of milligrams to tablet dose change.      Relevant Medications   sertraline (ZOLOFT) 50 MG tablet    Return in about 11 months (around 11/13/2022) for CPE and Labs.    Audria Nine, NP

## 2022-02-09 DIAGNOSIS — J340 Abscess, furuncle and carbuncle of nose: Secondary | ICD-10-CM | POA: Diagnosis not present

## 2022-02-09 DIAGNOSIS — R040 Epistaxis: Secondary | ICD-10-CM | POA: Diagnosis not present

## 2023-02-17 DIAGNOSIS — J069 Acute upper respiratory infection, unspecified: Secondary | ICD-10-CM | POA: Diagnosis not present

## 2023-02-17 DIAGNOSIS — J029 Acute pharyngitis, unspecified: Secondary | ICD-10-CM | POA: Diagnosis not present

## 2023-02-26 DIAGNOSIS — R051 Acute cough: Secondary | ICD-10-CM | POA: Diagnosis not present

## 2023-03-31 DIAGNOSIS — S92411A Displaced fracture of proximal phalanx of right great toe, initial encounter for closed fracture: Secondary | ICD-10-CM | POA: Diagnosis not present

## 2023-03-31 DIAGNOSIS — W500XXA Accidental hit or strike by another person, initial encounter: Secondary | ICD-10-CM | POA: Diagnosis not present

## 2023-03-31 DIAGNOSIS — F17220 Nicotine dependence, chewing tobacco, uncomplicated: Secondary | ICD-10-CM | POA: Diagnosis not present

## 2023-03-31 DIAGNOSIS — S92511A Displaced fracture of proximal phalanx of right lesser toe(s), initial encounter for closed fracture: Secondary | ICD-10-CM | POA: Diagnosis not present

## 2023-03-31 DIAGNOSIS — Y9368 Activity, volleyball (beach) (court): Secondary | ICD-10-CM | POA: Diagnosis not present

## 2023-03-31 DIAGNOSIS — M79674 Pain in right toe(s): Secondary | ICD-10-CM | POA: Diagnosis not present

## 2023-04-02 DIAGNOSIS — S92414A Nondisplaced fracture of proximal phalanx of right great toe, initial encounter for closed fracture: Secondary | ICD-10-CM | POA: Diagnosis not present

## 2023-04-16 DIAGNOSIS — S92414A Nondisplaced fracture of proximal phalanx of right great toe, initial encounter for closed fracture: Secondary | ICD-10-CM | POA: Diagnosis not present

## 2023-05-07 DIAGNOSIS — S92414A Nondisplaced fracture of proximal phalanx of right great toe, initial encounter for closed fracture: Secondary | ICD-10-CM | POA: Diagnosis not present

## 2023-05-07 DIAGNOSIS — S92424D Nondisplaced fracture of distal phalanx of right great toe, subsequent encounter for fracture with routine healing: Secondary | ICD-10-CM | POA: Diagnosis not present

## 2024-01-18 ENCOUNTER — Ambulatory Visit
Admission: RE | Admit: 2024-01-18 | Discharge: 2024-01-18 | Disposition: A | Discharge status: Home / Self Care | Source: Ambulatory Visit | Attending: Emergency Medicine | Admitting: Emergency Medicine

## 2024-01-18 VITALS — BP 123/78 | HR 80 | Temp 98.4°F | Resp 15 | Ht 71.0 in | Wt 160.0 lb

## 2024-01-18 DIAGNOSIS — J069 Acute upper respiratory infection, unspecified: Secondary | ICD-10-CM | POA: Diagnosis not present

## 2024-01-18 LAB — GROUP A STREP BY PCR: Group A Strep by PCR: NOT DETECTED

## 2024-01-18 MED ORDER — PROMETHAZINE-DM 6.25-15 MG/5ML PO SYRP: 5.0000 mL | ORAL_SOLUTION | Freq: Four times a day (QID) | ORAL | 0 refills | Status: AC | PRN

## 2024-01-18 MED ORDER — IPRATROPIUM BROMIDE 0.06 % NA SOLN: 2.0000 | Freq: Four times a day (QID) | NASAL | 12 refills | Status: AC

## 2024-01-18 MED ORDER — BENZONATATE 100 MG PO CAPS
200.0000 mg | ORAL_CAPSULE | Freq: Three times a day (TID) | ORAL | 0 refills | Status: AC
Start: 2024-01-18 — End: ?

## 2024-01-18 NOTE — ED Triage Notes (Signed)
 Patient c/o nasal congestion, runny nose, sore throat and headache that started on Monday.

## 2024-01-18 NOTE — Discharge Instructions (Signed)
 Your testing for strep today was negative.  Your exam is consistent with an upper respiratory infection.  It is most likely viral.  Use over-the-counter Tylenol  and/or ibuprofen according the pack instructions as needed for pain.  To soothe your throat you may gargle with warm salt water.  Mix 1 tablespoon of table salt in 8 ounces of warm water, gargle and spit.  You may also use over-the-counter Chloraseptic or Sucrets lozenges.  No more than 1 lozenge every 2 hours as the menthol may give you diarrhea.  Use the Atrovent nasal spray, 2 squirts in each nostril every 6 hours, as needed for runny nose and postnasal drip.  Use the Tessalon Perles every 8 hours during the day.  Take them with a small sip of water.  They may give you some numbness to the base of your tongue or a metallic taste in your mouth, this is normal.  Use the Promethazine DM cough syrup at bedtime for cough and congestion.  It will make you drowsy so do not take it during the day.  Return for reevaluation or see your primary care provider for any new or worsening symptoms.

## 2024-01-18 NOTE — ED Provider Notes (Signed)
 [facility]    CSN: [id number] Arrival date & time: 01/18/24  1605      History   Chief Complaint Chief Complaint  Patient presents with   Nasal Congestion    Appointment   Headache   Sore Throat    HPI Mike Diaz is a 23 y.o. male.   HPI  23 year old male with past medical history significant for reactive airway disease, GERD, anxiety, depression, and allergies presents for evaluation of respiratory symptoms that have been going on for the last 5 days.  He reports that started when he was in [place] .  His best friend's wife has similar symptoms.  He is endorsing nasal congestion, runny nose, sore throat, pressure in the right ear, headache, and nonproductive cough.  He denies any fever, shortness breath, or wheezing.  Past Medical History:  Diagnosis Date   Allergy 2015   Anxiety    Anxiety and depression 10/20/2021   GERD (gastroesophageal reflux disease)    Immunizations up to date in pediatric patient    Reactive airway disease     Patient Active Problem List   Diagnosis Date Noted   Anxiety and depression 10/20/2021   Preventative health care 10/20/2021   Other fatigue 10/20/2021   Closed fracture of phalanx or phalanges of hand 11/26/2014    Past Surgical History:  Procedure Laterality Date   NASAL HEMORRHAGE CONTROL     have done cautherization for this in the past   TONSILLECTOMY     UPPER GI ENDOSCOPY         Home Medications    Prior to Admission medications   Medication Sig Start Date End Date Taking? Authorizing Provider  benzonatate (TESSALON) 100 MG capsule Take 2 capsules (200 mg total) by mouth every 8 (eight) hours. 01/18/24  Yes Bernardino Ditch, NP  ipratropium (ATROVENT) 0.06 % nasal spray Place 2 sprays into both nostrils 4 (four) times daily. 01/18/24  Yes Bernardino Ditch, NP  promethazine-dextromethorphan (PROMETHAZINE-DM) 6.25-15 MG/5ML syrup Take 5 mLs by mouth 4 (four) times daily as needed. 01/18/24  Yes Bernardino Ditch, NP   fexofenadine (ALLEGRA) 180 MG tablet Take 180 mg by mouth daily.    [provider]    Family History Family History  Problem Relation Age of Onset   Hypertension Mother    Skin cancer Mother    Diabetes Father    Learning disabilities Father        dyslexia   Varicose Veins Father    Learning disabilities Sister        dyslexia   Other Sister        Potts disease   Brain cancer Maternal Grandmother    Pancreatic cancer Maternal Grandmother    Pancreatic cancer Maternal Grandfather    Colon cancer Maternal Grandfather    Brain cancer Paternal Grandmother    Brain cancer Paternal Grandfather    Lung cancer Paternal Grandfather     Social History Social History   Tobacco Use   Smoking status: Former    Types: E-cigarettes    Quit date: 06/02/2021    Years since quitting: 2.6    Passive exposure: Past   Smokeless tobacco: Former    Types: Chew   Tobacco comments:    I vaped and used smokeless tobacco for approximately 5/6 years-quit 06/2021  Vaping Use   Vaping status: Former   Quit date: 06/02/2021  Substance Use Topics   Alcohol use: Yes    Comment: maybe 4 beers a week  and sometimes less or more   Drug use: No     Allergies   Patient has no known allergies.   Review of Systems Review of Systems  Constitutional:  Negative for fever.  HENT:  Positive for ear pain, rhinorrhea and sore throat.   Respiratory:  Positive for cough. Negative for shortness of breath and wheezing.   Neurological:  Positive for headaches.     Physical Exam Triage Vital Signs ED Triage Vitals [01/18/24 1618]  Encounter Vitals Group     BP      Girls Systolic BP Percentile      Girls Diastolic BP Percentile      Boys Systolic BP Percentile      Boys Diastolic BP Percentile      Pulse      Resp      Temp      Temp src      SpO2      Weight      Height      Head Circumference      Peak Flow      Pain Score 3     Pain Loc      Pain Education      Exclude from  Growth Chart    No data found.  Updated Vital Signs BP 123/78 (BP Location: Right Arm)   Pulse 80   Temp 98.4 F (36.9 C) (Oral)   Resp 15   Ht 5' 11 (1.803 m)   Wt 160 lb (72.6 kg)   SpO2 97%   BMI 22.32 kg/m   Visual Acuity Right Eye Distance:   Left Eye Distance:   Bilateral Distance:    Right Eye Near:   Left Eye Near:    Bilateral Near:     Physical Exam Vitals and nursing note reviewed.  Constitutional:      Appearance: Normal appearance. He is not ill-appearing.  HENT:     Head: Normocephalic and atraumatic.     Right Ear: Tympanic membrane, ear canal and external ear normal. There is no impacted cerumen.     Left Ear: Tympanic membrane, ear canal and external ear normal. There is no impacted cerumen.     Nose: Congestion and rhinorrhea present.     Comments: This mucosa is edematous erythematous with scant clear discharge in both nares.    Mouth/Throat:     Mouth: Mucous membranes are moist.     Pharynx: Oropharynx is clear. Posterior oropharyngeal erythema present. No oropharyngeal exudate.     Comments: Tonsillar pillars are surgically absent.  Posterior oropharynx demonstrates erythema and injection.  No exudate noted. Cardiovascular:     Rate and Rhythm: Normal rate and regular rhythm.     Pulses: Normal pulses.     Heart sounds: Normal heart sounds. No murmur heard.    No friction rub. No gallop.  Pulmonary:     Effort: Pulmonary effort is normal.     Breath sounds: Normal breath sounds. No wheezing, rhonchi or rales.  Musculoskeletal:     Cervical back: Normal range of motion and neck supple. No tenderness.  Lymphadenopathy:     Cervical: No cervical adenopathy.  Skin:    General: Skin is warm and dry.     Capillary Refill: Capillary refill takes less than 2 seconds.     Findings: No rash.  Neurological:     General: No focal deficit present.     Mental Status: He is alert and oriented to person, place, and time.  UC Treatments /  Results  Labs (all labs ordered are listed, but only abnormal results are displayed) Labs Reviewed  GROUP A STREP BY PCR    EKG   Radiology No results found.  Procedures Procedures (including critical care time)  Medications Ordered in UC Medications - No data to display  Initial Impression / Assessment and Plan / UC Course  I have reviewed the triage vital signs and the nursing notes.  Pertinent labs & imaging results that were available during my care of the patient were reviewed by me and considered in my medical decision making (see chart for details).   Patient is a nontoxic-appearing 23 year old male presenting for evaluation of 5 days with respiratory symptoms as outlined HPI above.  He was recently in [place]  and he reports that his best friend and wife was also exhibiting symptoms similar to his.  Physical exam does reveal inflammation of his nasal mucosa with clear nasal discharge.  Also erythema and injection to the posterior oropharynx but no appreciable exudate.  Tonsillar pillars are surgically absent.  No cervical adenopathy present.  Cardiopulmonary exam physical lung sounds in all fields.  Differential diagnose include COVID, influenza, strep pharyngitis, viral respiratory illness.  Because she has had symptoms for 5 days I will not test him for COVID or influenza as he is outside the therapeutic window for antivirals and also outside the infectious window for COVID.  I will order a strep PCR.  Strep PCR is negative.  I will discharge patient home with a diagnosis of viral URI with a cough.  I will prescribe Atrovent nasal fate for nasal congestion and Tessalon Perles and provide some him cough syrup for cough and congestion.   Final Clinical Impressions(s) / UC Diagnoses   Final diagnoses:  Viral URI with cough     Discharge Instructions      Your testing for strep today was negative.  Your exam is consistent with an upper respiratory infection.  It is most  likely viral.  Use over-the-counter Tylenol  and/or ibuprofen according the pack instructions as needed for pain.  To soothe your throat you may gargle with warm salt water.  Mix 1 tablespoon of table salt in 8 ounces of warm water, gargle and spit.  You may also use over-the-counter Chloraseptic or Sucrets lozenges.  No more than 1 lozenge every 2 hours as the menthol may give you diarrhea.  Use the Atrovent nasal spray, 2 squirts in each nostril every 6 hours, as needed for runny nose and postnasal drip.  Use the Tessalon Perles every 8 hours during the day.  Take them with a small sip of water.  They may give you some numbness to the base of your tongue or a metallic taste in your mouth, this is normal.  Use the Promethazine DM cough syrup at bedtime for cough and congestion.  It will make you drowsy so do not take it during the day.  Return for reevaluation or see your primary care provider for any new or worsening symptoms.      ED Prescriptions     Medication Sig Dispense Auth. Provider   benzonatate (TESSALON) 100 MG capsule Take 2 capsules (200 mg total) by mouth every 8 (eight) hours. 21 capsule Bernardino Ditch, NP   ipratropium (ATROVENT) 0.06 % nasal spray Place 2 sprays into both nostrils 4 (four) times daily. 15 mL Bernardino Ditch, NP   promethazine-dextromethorphan (PROMETHAZINE-DM) 6.25-15 MG/5ML syrup Take 5 mLs by mouth 4 (four) times daily as  needed. 118 mL Bernardino Ditch, NP      PDMP not reviewed this encounter.   Bernardino Ditch, NP 01/18/24 1702
